# Patient Record
Sex: Female | Born: 1960 | Race: White | Hispanic: No | Marital: Married
Health system: Southern US, Community
[De-identification: ages and names within clinical notes are randomized; demographics above are authoritative.]

## PROBLEM LIST (undated history)

## (undated) HISTORY — PX: AUGMENTATION MAMMAPLASTY: SUR837

---

## 2007-05-28 ENCOUNTER — Emergency Department: Payer: Self-pay | Admitting: Emergency Medicine

## 2008-05-21 ENCOUNTER — Emergency Department: Payer: Self-pay | Admitting: Emergency Medicine

## 2008-07-13 ENCOUNTER — Emergency Department (HOSPITAL_COMMUNITY): Admission: EM | Admit: 2008-07-13 | Discharge: 2008-07-13 | Payer: Self-pay | Admitting: Emergency Medicine

## 2008-08-17 ENCOUNTER — Encounter: Admission: RE | Admit: 2008-08-17 | Discharge: 2008-10-24 | Payer: Self-pay | Admitting: Specialist

## 2008-11-07 ENCOUNTER — Encounter: Admission: RE | Admit: 2008-11-07 | Discharge: 2009-01-10 | Payer: Self-pay | Admitting: Orthopaedic Surgery

## 2009-02-04 ENCOUNTER — Emergency Department: Payer: Self-pay | Admitting: Emergency Medicine

## 2009-05-09 ENCOUNTER — Emergency Department (HOSPITAL_COMMUNITY): Admission: EM | Admit: 2009-05-09 | Discharge: 2009-05-09 | Payer: Self-pay | Admitting: Family Medicine

## 2009-05-15 ENCOUNTER — Ambulatory Visit: Payer: Self-pay | Admitting: Internal Medicine

## 2009-05-22 ENCOUNTER — Ambulatory Visit: Payer: Self-pay | Admitting: Internal Medicine

## 2009-05-29 ENCOUNTER — Emergency Department: Payer: Self-pay | Admitting: Unknown Physician Specialty

## 2009-06-08 ENCOUNTER — Ambulatory Visit: Payer: Self-pay | Admitting: Neurology

## 2010-08-29 ENCOUNTER — Emergency Department: Payer: Self-pay | Admitting: Emergency Medicine

## 2011-07-22 ENCOUNTER — Emergency Department (HOSPITAL_COMMUNITY): Payer: No Typology Code available for payment source

## 2011-07-22 ENCOUNTER — Other Ambulatory Visit (HOSPITAL_COMMUNITY): Payer: Self-pay | Admitting: Emergency Medicine

## 2011-07-22 ENCOUNTER — Emergency Department (HOSPITAL_COMMUNITY)
Admission: EM | Admit: 2011-07-22 | Discharge: 2011-07-22 | Disposition: A | Discharge status: Home / Self Care | Payer: No Typology Code available for payment source | Attending: Emergency Medicine | Admitting: Emergency Medicine

## 2011-07-22 ENCOUNTER — Ambulatory Visit (HOSPITAL_COMMUNITY)
Admission: RE | Admit: 2011-07-22 | Discharge: 2011-07-22 | Disposition: A | Discharge status: Home / Self Care | Payer: No Typology Code available for payment source | Source: Ambulatory Visit | Attending: Emergency Medicine | Admitting: Emergency Medicine

## 2011-07-22 DIAGNOSIS — Z79899 Other long term (current) drug therapy: Secondary | ICD-10-CM | POA: Insufficient documentation

## 2011-07-22 DIAGNOSIS — R112 Nausea with vomiting, unspecified: Secondary | ICD-10-CM | POA: Insufficient documentation

## 2011-07-22 DIAGNOSIS — M542 Cervicalgia: Secondary | ICD-10-CM | POA: Insufficient documentation

## 2011-07-22 DIAGNOSIS — R51 Headache: Secondary | ICD-10-CM | POA: Insufficient documentation

## 2011-07-22 DIAGNOSIS — M79609 Pain in unspecified limb: Secondary | ICD-10-CM | POA: Insufficient documentation

## 2011-07-22 DIAGNOSIS — S8010XA Contusion of unspecified lower leg, initial encounter: Secondary | ICD-10-CM | POA: Insufficient documentation

## 2011-07-22 DIAGNOSIS — R000 Tachycardia, unspecified: Secondary | ICD-10-CM | POA: Insufficient documentation

## 2011-07-22 DIAGNOSIS — F341 Dysthymic disorder: Secondary | ICD-10-CM | POA: Insufficient documentation

## 2012-03-27 ENCOUNTER — Emergency Department: Payer: Self-pay | Admitting: Emergency Medicine

## 2012-12-08 ENCOUNTER — Emergency Department: Payer: Self-pay | Admitting: Emergency Medicine

## 2012-12-08 LAB — BASIC METABOLIC PANEL
Anion Gap: 8 (ref 7–16)
BUN: 18 mg/dL (ref 7–18)
Calcium, Total: 9.9 mg/dL (ref 8.5–10.1)
EGFR (Non-African Amer.): >60
Osmolality: 274 (ref 275–301)

## 2012-12-08 LAB — CBC
HCT: 47.6 % — ABNORMAL HIGH (ref 35.0–47.0)
HGB: 16 g/dL (ref 12.0–16.0)
MCH: 30.5 pg (ref 26.0–34.0)
MCV: 91 fL (ref 80–100)
RBC: 5.24 10*6/uL — ABNORMAL HIGH (ref 3.80–5.20)
RDW: 13.2 % (ref 11.5–14.5)
WBC: 13.3 10*3/uL — ABNORMAL HIGH (ref 3.6–11.0)

## 2012-12-08 LAB — TROPONIN I
Troponin-I: <0.02 ng/mL
Troponin-I: <0.02 ng/mL

## 2012-12-08 LAB — CK TOTAL AND CKMB (NOT AT ARMC): CK, Total: 132 U/L (ref 21–215)

## 2013-05-19 ENCOUNTER — Ambulatory Visit: Payer: Self-pay | Admitting: Physician Assistant

## 2014-05-26 ENCOUNTER — Ambulatory Visit: Payer: Self-pay | Admitting: Gastroenterology

## 2014-09-20 ENCOUNTER — Ambulatory Visit: Payer: Self-pay | Admitting: Physician Assistant

## 2017-03-26 ENCOUNTER — Other Ambulatory Visit: Payer: Self-pay | Admitting: Physician Assistant

## 2017-03-26 DIAGNOSIS — Z1231 Encounter for screening mammogram for malignant neoplasm of breast: Secondary | ICD-10-CM

## 2017-05-13 ENCOUNTER — Encounter: Payer: Self-pay | Admitting: Radiology

## 2017-05-13 ENCOUNTER — Ambulatory Visit
Admission: RE | Admit: 2017-05-13 | Discharge: 2017-05-13 | Disposition: A | Discharge status: Home / Self Care | Payer: Managed Care, Other (non HMO) | Source: Ambulatory Visit | Attending: Physician Assistant | Admitting: Physician Assistant

## 2017-05-13 DIAGNOSIS — Z1231 Encounter for screening mammogram for malignant neoplasm of breast: Secondary | ICD-10-CM | POA: Diagnosis present

## 2017-05-18 ENCOUNTER — Other Ambulatory Visit: Payer: Self-pay | Admitting: Physician Assistant

## 2017-05-18 ENCOUNTER — Ambulatory Visit
Admission: RE | Admit: 2017-05-18 | Discharge: 2017-05-18 | Disposition: A | Discharge status: Home / Self Care | Payer: Managed Care, Other (non HMO) | Source: Ambulatory Visit | Attending: Physician Assistant | Admitting: Physician Assistant

## 2017-05-18 DIAGNOSIS — R1031 Right lower quadrant pain: Secondary | ICD-10-CM | POA: Insufficient documentation

## 2017-05-18 DIAGNOSIS — Z90710 Acquired absence of both cervix and uterus: Secondary | ICD-10-CM | POA: Insufficient documentation

## 2017-05-18 MED ORDER — IOPAMIDOL (ISOVUE-300) INJECTION 61%
100.0000 mL | Freq: Once | INTRAVENOUS | Status: AC | PRN
  Administered 2017-05-18: 100 mL via INTRAVENOUS

## 2019-10-06 ENCOUNTER — Emergency Department
Admission: EM | Admit: 2019-10-06 | Discharge: 2019-10-06 | Discharge status: Against Medical Advice | Payer: Managed Care, Other (non HMO)

## 2019-10-06 NOTE — ED Triage Notes (Signed)
First Nurse Note:  Arrives with c/o water bottle display falling on her at the [facility].  C/O bilateral leg pain.  Patient appears anxious.  AAOx3.  Skin warm and dry. NAD

## 2020-12-19 ENCOUNTER — Other Ambulatory Visit: Payer: Self-pay | Admitting: Physician Assistant

## 2020-12-19 DIAGNOSIS — Z1231 Encounter for screening mammogram for malignant neoplasm of breast: Secondary | ICD-10-CM

## 2021-01-08 ENCOUNTER — Other Ambulatory Visit: Payer: Self-pay

## 2021-01-08 ENCOUNTER — Ambulatory Visit
Admission: RE | Admit: 2021-01-08 | Discharge: 2021-01-08 | Disposition: A | Discharge status: Home / Self Care | Payer: BC Managed Care – PPO | Source: Ambulatory Visit | Attending: Physician Assistant | Admitting: Physician Assistant

## 2021-01-08 DIAGNOSIS — Z1231 Encounter for screening mammogram for malignant neoplasm of breast: Secondary | ICD-10-CM | POA: Insufficient documentation

## 2021-10-14 ENCOUNTER — Other Ambulatory Visit: Payer: Self-pay | Admitting: Physician Assistant

## 2021-10-14 DIAGNOSIS — Z1231 Encounter for screening mammogram for malignant neoplasm of breast: Secondary | ICD-10-CM

## 2022-01-14 ENCOUNTER — Ambulatory Visit
Admission: RE | Admit: 2022-01-14 | Discharge: 2022-01-14 | Disposition: A | Discharge status: Home / Self Care | Payer: BC Managed Care – PPO | Source: Ambulatory Visit | Attending: Physician Assistant | Admitting: Physician Assistant

## 2022-01-14 ENCOUNTER — Other Ambulatory Visit: Payer: Self-pay

## 2022-01-14 DIAGNOSIS — Z1231 Encounter for screening mammogram for malignant neoplasm of breast: Secondary | ICD-10-CM | POA: Diagnosis present

## 2022-07-30 IMAGING — MG MM DIGITAL SCREENING BILAT W/ TOMO AND CAD
8 series · 9 of 24 positions shown · non-contrast
Comparison: Previous exam(s).

CLINICAL DATA: Screening.

EXAM:
DIGITAL SCREENING BILATERAL MAMMOGRAM WITH TOMOSYNTHESIS AND CAD
TECHNIQUE: Bilateral screening digital craniocaudal and mediolateral oblique
mammograms were obtained. Bilateral screening digital breast
tomosynthesis was performed. The images were evaluated with
computer-aided detection.

[R MLO synth-2D]
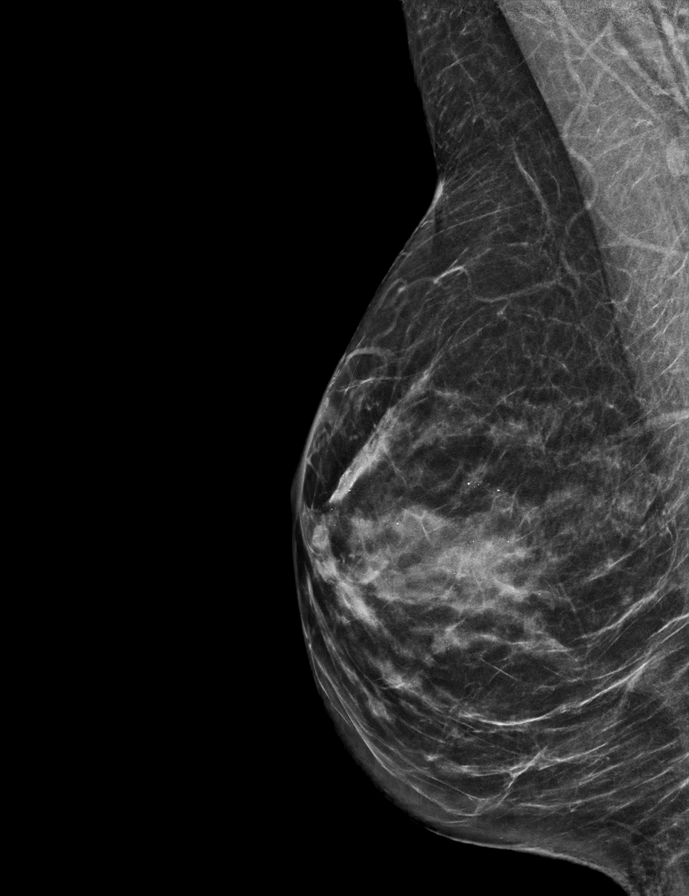

[L MLO synth-2D]
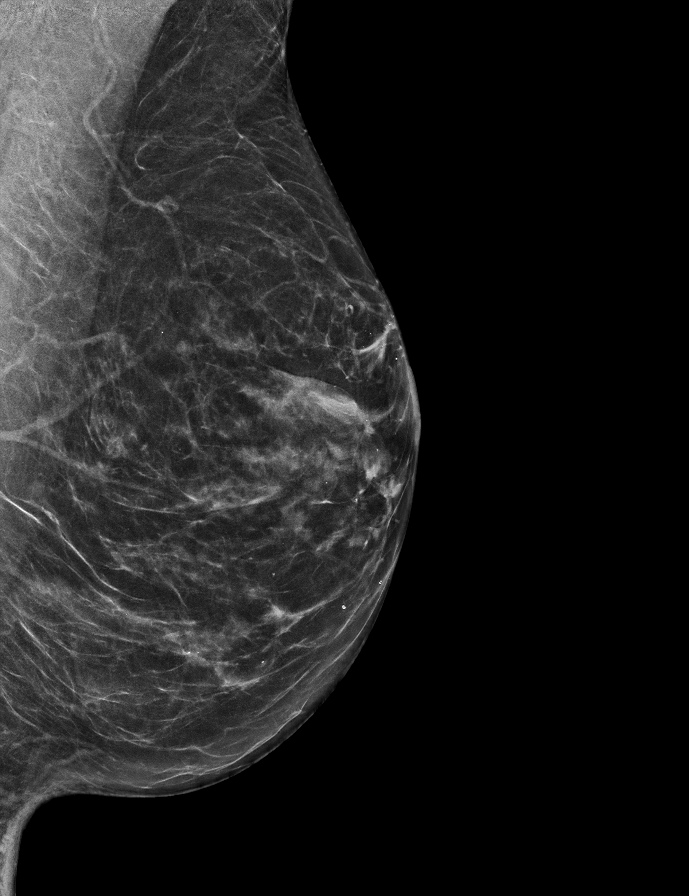

[L CC synth-2D]
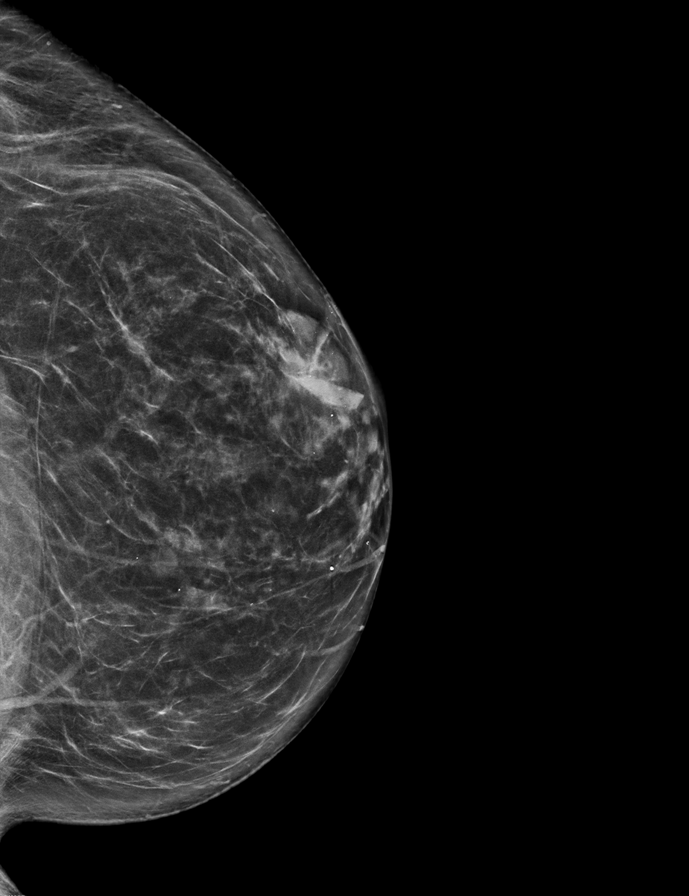

[R CC synth-2D]
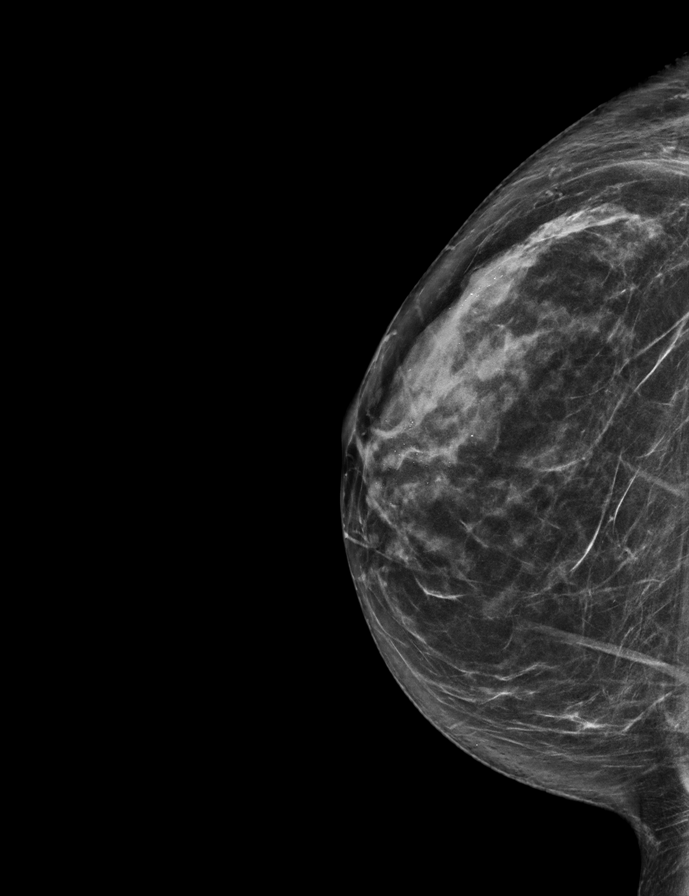

[R CC tomo · 2 of 63 frames shown]
[frame 21/63]
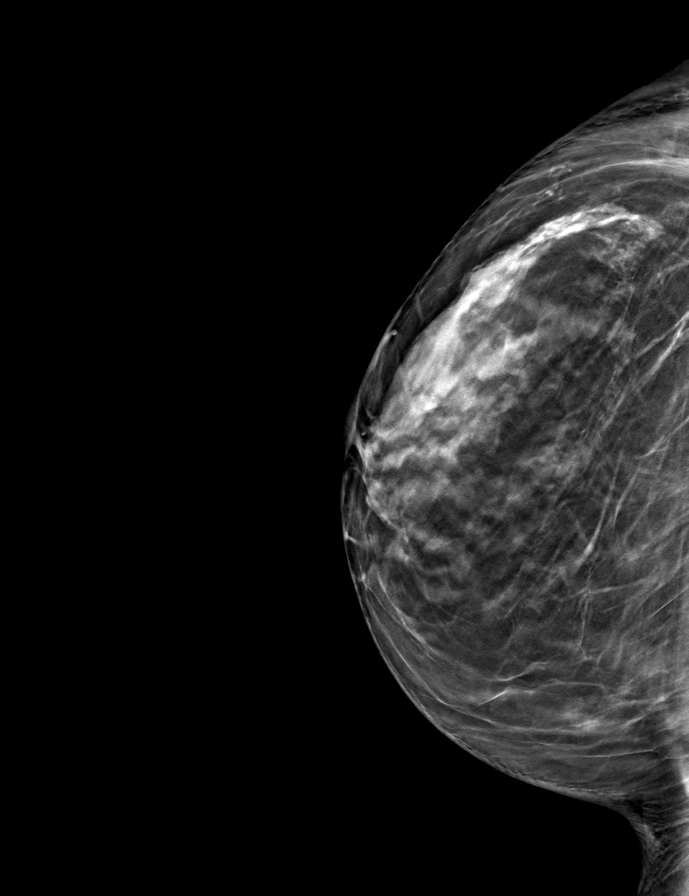
[frame 32/63]
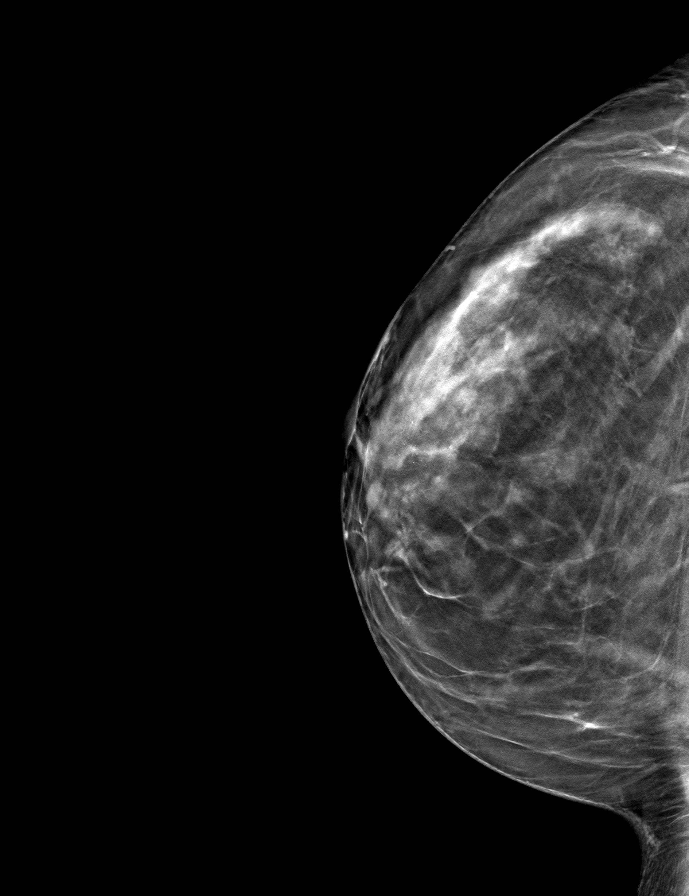

[L CC tomo · tomo slice 33/64.0]
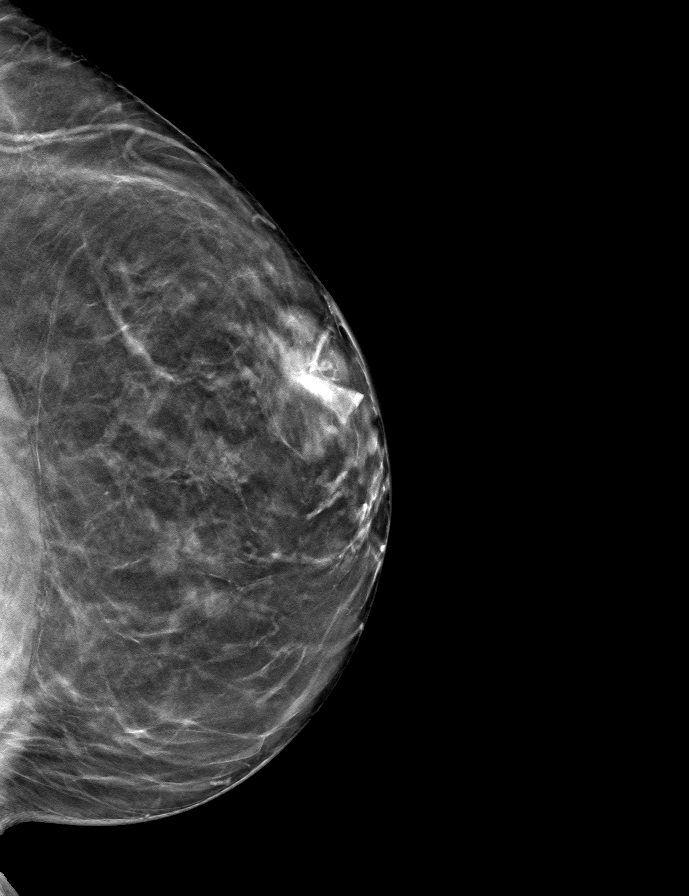

[L MLO tomo · tomo slice 31/61.0]
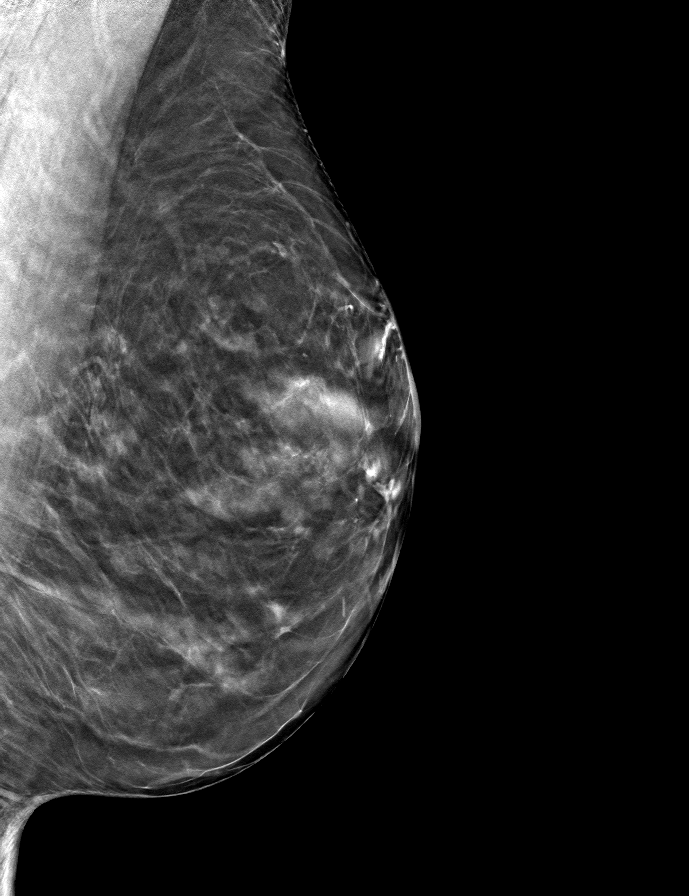

[R MLO tomo · tomo slice 28/55.0]
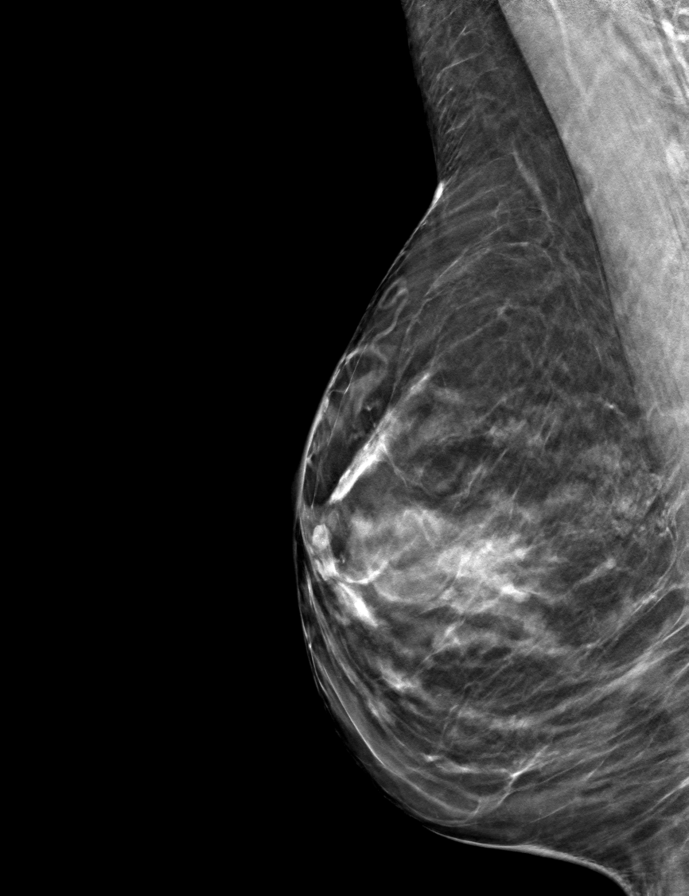

[9 of 24 positions shown; findings below may reference images not displayed]

ACR Breast Density Category c: The breast tissue is heterogeneously
dense, which may obscure small masses.
FINDINGS: There are no findings suspicious for malignancy.
IMPRESSION: No mammographic evidence of malignancy. A result letter of this
screening mammogram will be mailed directly to the patient.

RECOMMENDATION:
Screening mammogram in one year. (Code:[id number]-BC3)

BI-RADS CATEGORY  1: Negative.

## 2023-08-19 ENCOUNTER — Other Ambulatory Visit: Payer: Self-pay | Admitting: Physician Assistant

## 2023-08-19 DIAGNOSIS — R195 Other fecal abnormalities: Secondary | ICD-10-CM

## 2023-08-19 DIAGNOSIS — K921 Melena: Secondary | ICD-10-CM

## 2023-08-19 DIAGNOSIS — R109 Unspecified abdominal pain: Secondary | ICD-10-CM

## 2023-08-21 ENCOUNTER — Ambulatory Visit
Admission: RE | Admit: 2023-08-21 | Discharge: 2023-08-21 | Disposition: A | Discharge status: Home / Self Care | Payer: BC Managed Care – PPO | Source: Ambulatory Visit | Attending: Physician Assistant | Admitting: Physician Assistant

## 2023-08-21 ENCOUNTER — Other Ambulatory Visit: Payer: BC Managed Care – PPO

## 2023-08-21 DIAGNOSIS — R195 Other fecal abnormalities: Secondary | ICD-10-CM

## 2023-08-21 DIAGNOSIS — R109 Unspecified abdominal pain: Secondary | ICD-10-CM

## 2023-08-21 DIAGNOSIS — K921 Melena: Secondary | ICD-10-CM

## 2023-08-21 MED ORDER — IOPAMIDOL (ISOVUE-300) INJECTION 61%
200.0000 mL | Freq: Once | INTRAVENOUS | Status: AC | PRN
  Administered 2023-08-21: 100 mL via INTRAVENOUS

## 2023-08-27 ENCOUNTER — Other Ambulatory Visit: Payer: Self-pay | Admitting: Medical Genetics

## 2023-08-27 DIAGNOSIS — Z006 Encounter for examination for normal comparison and control in clinical research program: Secondary | ICD-10-CM

## 2023-09-09 ENCOUNTER — Other Ambulatory Visit: Payer: Self-pay | Admitting: Physician Assistant

## 2023-09-09 DIAGNOSIS — Z1231 Encounter for screening mammogram for malignant neoplasm of breast: Secondary | ICD-10-CM

## 2024-01-08 ENCOUNTER — Ambulatory Visit: Payer: Self-pay

## 2024-01-08 DIAGNOSIS — Z1211 Encounter for screening for malignant neoplasm of colon: Secondary | ICD-10-CM | POA: Diagnosis present

## 2024-01-08 DIAGNOSIS — K5730 Diverticulosis of large intestine without perforation or abscess without bleeding: Secondary | ICD-10-CM | POA: Diagnosis not present

## 2024-04-23 ENCOUNTER — Other Ambulatory Visit: Payer: Self-pay

## 2024-04-23 ENCOUNTER — Emergency Department: Admission: EM | Admit: 2024-04-23 | Discharge: 2024-04-23 | Disposition: A | Discharge status: Home / Self Care

## 2024-04-23 ENCOUNTER — Emergency Department

## 2024-04-23 DIAGNOSIS — B029 Zoster without complications: Secondary | ICD-10-CM | POA: Diagnosis not present

## 2024-04-23 DIAGNOSIS — R0602 Shortness of breath: Secondary | ICD-10-CM | POA: Diagnosis present

## 2024-04-23 DIAGNOSIS — T782XXA Anaphylactic shock, unspecified, initial encounter: Secondary | ICD-10-CM | POA: Insufficient documentation

## 2024-04-23 LAB — CBC WITH DIFFERENTIAL/PLATELET
Abs Immature Granulocytes: 0.02 10*3/uL (ref 0.00–0.07)
Basophils Absolute: 0 10*3/uL (ref 0.0–0.1)
Basophils Relative: 1 %
Eosinophils Absolute: 0.1 10*3/uL (ref 0.0–0.5)
Eosinophils Relative: 2 %
HCT: 39.1 % (ref 36.0–46.0)
Hemoglobin: 13.3 g/dL (ref 12.0–15.0)
Immature Granulocytes: 0 %
Lymphocytes Relative: 22 %
Lymphs Abs: 1.4 10*3/uL (ref 0.7–4.0)
MCH: 30.6 pg (ref 26.0–34.0)
MCHC: 34 g/dL (ref 30.0–36.0)
MCV: 89.9 fL (ref 80.0–100.0)
Monocytes Absolute: 0.6 10*3/uL (ref 0.1–1.0)
Monocytes Relative: 10 %
Neutro Abs: 4.2 10*3/uL (ref 1.7–7.7)
Neutrophils Relative %: 65 %
Platelets: 375 10*3/uL (ref 150–400)
RBC: 4.35 MIL/uL (ref 3.87–5.11)
RDW: 13.5 % (ref 11.5–15.5)
WBC: 6.5 10*3/uL (ref 4.0–10.5)
nRBC: 0 % (ref 0.0–0.2)

## 2024-04-23 LAB — TROPONIN I (HIGH SENSITIVITY)
Troponin I (High Sensitivity): 3 ng/L (ref <18)
Troponin I (High Sensitivity): 3 ng/L (ref <18)

## 2024-04-23 LAB — COMPREHENSIVE METABOLIC PANEL WITH GFR
ALT: 18 U/L (ref 0–44)
AST: 20 U/L (ref 15–41)
Albumin: 3.8 g/dL (ref 3.5–5.0)
Alkaline Phosphatase: 77 U/L (ref 38–126)
Anion gap: 10 (ref 5–15)
BUN: 16 mg/dL (ref 8–23)
CO2: 24 mmol/L (ref 22–32)
Calcium: 10.4 mg/dL — ABNORMAL HIGH (ref 8.9–10.3)
Chloride: 103 mmol/L (ref 98–111)
Creatinine, Ser: 0.79 mg/dL (ref 0.44–1.00)
GFR, Estimated: >60 mL/min (ref >60)
Glucose, Bld: 97 mg/dL (ref 70–99)
Potassium: 3.3 mmol/L — ABNORMAL LOW (ref 3.5–5.1)
Sodium: 137 mmol/L (ref 135–145)
Total Bilirubin: 0.9 mg/dL (ref 0.0–1.2)
Total Protein: 7.3 g/dL (ref 6.5–8.1)

## 2024-04-23 LAB — LIPASE, BLOOD: Lipase: 36 U/L (ref 11–51)

## 2024-04-23 MED ORDER — FAMOTIDINE IN NACL 20-0.9 MG/50ML-% IV SOLN
20.0000 mg | Freq: Once | INTRAVENOUS | Status: AC
  Administered 2024-04-23: 20 mg via INTRAVENOUS
  Filled 2024-04-23: qty 50

## 2024-04-23 MED ORDER — VALACYCLOVIR HCL 500 MG PO TABS: 1000.0000 mg | ORAL_TABLET | Freq: Three times a day (TID) | ORAL | 0 refills | Status: AC

## 2024-04-23 MED ORDER — EPINEPHRINE 0.3 MG/0.3ML IJ SOAJ: 0.3000 mg | INTRAMUSCULAR | 1 refills | Status: AC | PRN

## 2024-04-23 MED ORDER — PREDNISONE 20 MG PO TABS: 40.0000 mg | ORAL_TABLET | Freq: Every day | ORAL | 0 refills | Status: AC

## 2024-04-23 MED ORDER — PREDNISONE 20 MG PO TABS: 40.0000 mg | ORAL_TABLET | Freq: Every day | ORAL | 0 refills | Status: DC

## 2024-04-23 MED ORDER — EPINEPHRINE 0.3 MG/0.3ML IJ SOAJ: 0.3000 mg | INTRAMUSCULAR | 1 refills | Status: DC | PRN

## 2024-04-23 MED ORDER — VALACYCLOVIR HCL 500 MG PO TABS: 1000.0000 mg | ORAL_TABLET | Freq: Three times a day (TID) | ORAL | 0 refills | Status: DC

## 2024-04-23 MED ORDER — DIPHENHYDRAMINE HCL 50 MG/ML IJ SOLN
25.0000 mg | Freq: Once | INTRAMUSCULAR | Status: AC
  Administered 2024-04-23: 25 mg via INTRAVENOUS
  Filled 2024-04-23: qty 1

## 2024-04-23 MED ORDER — DIPHENHYDRAMINE HCL 25 MG PO CAPS: 25.0000 mg | ORAL_CAPSULE | Freq: Four times a day (QID) | ORAL | 0 refills | Status: DC | PRN

## 2024-04-23 MED ORDER — PREDNISONE 20 MG PO TABS
60.0000 mg | ORAL_TABLET | Freq: Once | ORAL | Status: AC
  Administered 2024-04-23: 60 mg via ORAL
  Filled 2024-04-23: qty 3

## 2024-04-23 MED ORDER — EPINEPHRINE 0.3 MG/0.3ML IJ SOAJ
0.3000 mg | Freq: Once | INTRAMUSCULAR | Status: AC
  Administered 2024-04-23: 0.3 mg via INTRAMUSCULAR
  Filled 2024-04-23: qty 0.3

## 2024-04-23 MED ORDER — DIPHENHYDRAMINE HCL 25 MG PO CAPS: 25.0000 mg | ORAL_CAPSULE | Freq: Four times a day (QID) | ORAL | 0 refills | Status: AC | PRN

## 2024-04-23 NOTE — ED Provider Notes (Signed)
 [facility] Provider Note    Event Date/Time   First MD Initiated Contact with Patient 04/23/24 1300     (approximate)   History   Shortness of Breath  Pt [facility] for SOB, chest pain, and a rash. 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 Patient has a blistering rash to her mid back, left lateral flank/breast area. Pt recently completed abx Clindamycin for UTI. Pt appears SOB and pausing to speak in full sentences.    HPI Selena Brown is a 63 y.o. 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  Did not have any exposure to the medications.  Notes that she got a little on her neck and back.  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      Physical Exam   Triage Vital Signs: BP (!) 158/75   Pulse (!) 106   Temp 98.7 F (37.1 C) (Oral)   Resp 13   Ht 5' 4 (1.626 m)   Wt 66.2 kg   SpO2 98%   BMI 25.06 kg/m      Most recent vital signs: Vitals:   04/23/24 1330 04/23/24 1400  BP: (!) 161/86 (!) 158/75  Pulse: 95 (!) 106  Resp: (!) 26 13  Temp:    SpO2: 99% 98%     General: Awake, no distress but +tachypnic HEENT:  No facial swelling, no tongue swelling, no uvular edema.  No stridor. CV:  Good peripheral perfusion. RRR, RP 2+ Resp:  Notably tachypneic with a respiratory rate of around 30 during my eval, 3-5 word sentences.  No obvious wheezing appreciated  bilaterally and otherwise clear lungs. Abd:  No distention. Nontender to deep palpation throughout Other:  + Thematous rash across left mid back extending toward left midline.  Some mild vesicular lesions appreciated on back only.  Not particularly sensitive to touch.   ED Results / Procedures / Treatments   Labs (all labs ordered are listed, but only abnormal results are displayed) Labs Reviewed  COMPREHENSIVE METABOLIC PANEL WITH GFR - Abnormal; Notable for the following components:      Result Value   Potassium 3.3 (*)    Calcium 10.4 (*)    All other components within normal limits  HSV CULTURE AND TYPING  CBC WITH DIFFERENTIAL/PLATELET  LIPASE, BLOOD  TROPONIN I (HIGH SENSITIVITY)     EKG  Ecg = sinus rhythm, rate 96, no gross ST elevation or depression, no significant repolarization abnormality, normal axis, normal intervals.  No clear evidence of ischemia nor arrhythmia on my interpretation.   RADIOLOGY Chest x-ray interpreted by myself and radiology report reviewed.  No acute pathology.    PROCEDURES:  Critical Care performed: Yes, see critical care procedure note(s)  .Critical Care  Performed by: Clarine Ozell LABOR, MD Authorized by: Clarine Ozell LABOR, MD   Critical care provider statement:    Critical care time (minutes):  30   Critical care time was exclusive of:  Separately billable procedures and treating other patients   Critical care was necessary to treat or prevent imminent or life-threatening deterioration of the following conditions: anaphylaxis.   Critical care was time spent personally by me on the following activities:  Development of treatment plan with patient or surrogate, discussions with consultants, evaluation of patient's response to treatment, examination of patient, ordering and review of laboratory studies, ordering and review of radiographic studies, ordering and performing treatments and interventions, pulse oximetry, re-evaluation of patient's  condition and review of old charts   I assumed direction of critical care for this patient from another provider in my specialty: no      MEDICATIONS ORDERED IN ED: Medications  famotidine  (PEPCID ) IVPB 20 mg premix (0 mg Intravenous Stopped 04/23/24 1600)  predniSONE  (DELTASONE ) tablet 60 mg (has no administration in time range)  EPINEPHrine  (EPI-PEN) injection 0.3 mg (0.3 mg Intramuscular Given 04/23/24 1328)  diphenhydrAMINE  (BENADRYL ) injection 25 mg (25 mg Intravenous Given 04/23/24 1328)     IMPRESSION / MDM / ASSESSMENT AND PLAN / ED COURSE  I reviewed the triage vital signs and the nursing notes.                              DDX/MDM/AP: Differential diagnosis includes, but is not limited to, anaphylaxis given the onset of rash with shortness of breath and history of prior anaphylactic episodes though presentation is not stereotypic, highly doubt ACS and does not appear particularly anxious on my eval.  Doubt PE.  Plan: - EpiPen , famotidine , Benadryl  with close reassessment - Labs - EKG - Cardiac monitor - CXR  Patient's presentation is most consistent with acute presentation with potential threat to life or bodily function.  The patient is on the cardiac monitor to evaluate for evidence of arrhythmia and/or significant heart rate changes.  ED course below.  Patient did respond very well to EpiPen , Benadryl , famotidine  with full resolution of her shortness of breath as well as nausea/epigastric discomfort.  Rash on her back is persistent, does extend from the midline back to midline chest and a dermatomal distribution, concern for shingles though concomitant presentation with anaphylaxis is abnormal.  Nonetheless, exam is concerning.  Will attempt to gather HSV swab here and in shared decision making patient would like to proceed with treatment for shingles.  Also giving first dose of oral steroids here in emergency department for apparent anaphylactic reaction to unclear  precipitant.  Rx EpiPen , prednisone , Benadryl , valacyclovir.  Plan for close PMD follow-up.  ED return precautions in place.  Patient signed out to oncoming ED provider pending 4-hour observation timeframe to ensure no need for repeat epinephrine  dosing.  Tentative plan for discharge home around 5:30 PM assuming a repeat dosing needed.  Clinical Course as of 04/23/24 1438  Sat Apr 23, 2024  1416 Patient reevaluated, looks and feels much better.  All shortness of breath has resolved.  Does still have erythematous rash with some vesicles that spreads from midline left middle thorax to left chest.  Does seem to have responded corporately to treatment of anaphylaxis, will start on steroids.  Regarding rash, I do have concern for shingles so this combination presentation is atypical.  In shared decision making, she would like to proceed with empiric treatment with an antiviral.  Will attempt to collect viral culture here as well. [MM]  1428 CBC, CMP reviewed, unremarkable beyond mild hypokalemia, will replete p.o. [MM]    Clinical Course User  Index [MM] Clarine Ozell LABOR, MD     FINAL CLINICAL IMPRESSION(S) / ED DIAGNOSES   Final diagnoses:  Anaphylaxis, initial encounter  Herpes zoster without complication     Rx / DC Orders   ED Discharge Orders          Ordered    predniSONE  (DELTASONE ) 20 MG tablet  Daily with breakfast        04/23/24 1432    valACYclovir (VALTREX) 500 MG tablet  3 times daily        04/23/24 1432    EPINEPHrine  0.3 mg/0.3 mL IJ SOAJ injection  As needed        04/23/24 1432    diphenhydrAMINE  (BENADRYL  ALLERGY) 25 mg capsule  Every 6 hours PRN        04/23/24 1432             Note:  This document was prepared using Dragon voice recognition software and may include unintentional dictation errors.   Clarine Ozell LABOR, MD 04/23/24 1438

## 2024-04-23 NOTE — ED Triage Notes (Signed)
 Pt [facility] for SOB, chest pain, and a rash. 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 Patient has a blistering rash to her mid back, left lateral flank/breast area. Pt recently completed abx Clindamycin for UTI. Pt appears SOB and pausing to speak in full sentences.

## 2024-04-23 NOTE — Discharge Instructions (Addendum)
 Your evaluation in the emergency department was concerning for a likely anaphylactic episode.  Separately, I am also concerned that you may have developed shingles.  I have prescribed you an EpiPen  to use as needed for any recurrent similar episodes as well as a course of steroids and Benadryl .  I have also prescribed you an antiviral medication to treat the shingles.  Please do follow-up closely with your primary care provider for reevaluation, and return to the emergency department with any new or worsening symptoms.

## 2024-04-23 NOTE — ED Notes (Signed)
 ED Provider at bedside.

## 2024-04-24 LAB — HSV 1/2 PCR (SURFACE)
HSV-1 DNA: NOT DETECTED
HSV-2 DNA: NOT DETECTED

## 2024-08-31 ENCOUNTER — Other Ambulatory Visit: Payer: Self-pay | Admitting: Medical Genetics

## 2024-08-31 DIAGNOSIS — Z006 Encounter for examination for normal comparison and control in clinical research program: Secondary | ICD-10-CM
